# Patient Record
Sex: Female | Born: 2000 | Race: Black or African American | Hispanic: No | Marital: Single | Smoking: Never smoker
Health system: Southern US, Community
[De-identification: ages and names within clinical notes are randomized; demographics above are authoritative.]

---
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
## 2022-03-17 ENCOUNTER — Ambulatory Visit: Payer: Medicaid Other | Admitting: Obstetrics & Gynecology

## 2023-03-26 NOTE — ED Provider Notes (Signed)
 [facility]  Emergency Department Provider Note     History   Chief Complaint Pelvic Pain and Back Pain (Right/)   HPI  Mikayla Hoffman is a 22 y.o. female right lower abdomen and right flank pain for the past 4 to 5 days.  Fever chills.  Started off with what sounded like or felt like was a urinary tract infection.  No falls or injury no change in bowel habit or pattern no vaginal bleeding or discharge no chest pain or shortness of breath no coughing.  No rash in that area.  Pain does not get worse with walking..    Past Medical History:  Diagnosis Date  . Ovarian cyst     No past surgical history on file.  Prior to Admission medications   Medication Dose, Route, Frequency  ibuprofen  (MOTRIN ) 400 MG tablet 400 mg, Oral, Every 6 hours PRN    Allergies Patient has no known allergies.   Social History   Tobacco Use  . Smoking status: Never  . Smokeless tobacco: Never  Substance Use Topics  . Alcohol use: Yes    Alcohol/week: 2.0 standard drinks of alcohol    Types: 2 Shots of liquor per week  . Drug use: Yes    Types: Marijuana    Review of Systems  Gastrointestinal:  Positive for abdominal pain.  Genitourinary:  Positive for dysuria.  All other systems reviewed and are negative.   As in HPI, all systems reviewed and otherwise negative.  Physical Exam    Vitals:   03/26/23 0839  BP: 128/61  Pulse: 67  Resp: 17  Temp: 36.1 C (96.9 F)  TempSrc: Temporal  SpO2: 98%  Weight: 85.4 kg (188 lb 3.2 oz)  Height: 172.7 cm (5' 8)     Physical Exam  Constitutional: Patient appears well-developed and well nourished. Non toxic in appearance. HEENT: Unremarkable. Head: Atraumatic.  Eyes: Normal ocular movements. Neck: Supple with normal range of motion.  Pulmonary/Chest: Effort normal. No respiratory distress. Abdominal: Soft and right abdominal tenderness right flank tenderness and right CVA tenderness. Musculoskeletal: Extremities  atraumatic. Neurological: Alert with no focal neurological deficit. Ambulatory with a steady gait. Skin: Warm and dry.  Nursing note and vital signs reviewed.   ED Course        Medications ordered during this encounter  Medications  . ciprofloxacin HCl (CIPRO) tablet 500 mg  . acetaminophen (TYLENOL) tablet 650 mg     Medical Decision Making   I have reviewed the vital signs and the nursing notes. Labs and radiology results that were available during my care of the patient were independently reviewed by me and considered in my medical decision making.      Medical Decision Making    Differential Diagnosis: Acute pyelonephritis, UTI, diverticulitis.      Radiology   US  Pelvis Transabdominal/Transvaginal    (Results Pending)  CT Abdomen Pelvis Without Contrast (Renal Protocol)    (Results Pending)    ED Clinical Impression   Final diagnoses:  None   Acute pyelonephritis  Procedures      This record has been created using [employer]. Chart creation errors have been sought, but may not always have been located. Such creation errors do not reflect on the standard of medical care.   Maree Jonelle Lash, MD 03/26/23 [phone]
